# Patient Record
Sex: Male | Born: 2000 | Race: Black or African American | Hispanic: No | Marital: Single | Smoking: Former smoker
Health system: Southern US, Community
[De-identification: ages and names within clinical notes are randomized; demographics above are authoritative.]

## PROBLEM LIST (undated history)

## (undated) DIAGNOSIS — Z789 Other specified health status: Secondary | ICD-10-CM

## (undated) HISTORY — PX: NO PAST SURGERIES: SHX2092

## (undated) HISTORY — DX: Other specified health status: Z78.9

---
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
## 2019-03-28 ENCOUNTER — Ambulatory Visit (INDEPENDENT_AMBULATORY_CARE_PROVIDER_SITE_OTHER): Payer: BC Managed Care – PPO | Admitting: Internal Medicine

## 2019-03-28 ENCOUNTER — Encounter: Payer: Self-pay | Admitting: Internal Medicine

## 2019-03-28 VITALS — Ht 72.0 in | Wt 169.0 lb

## 2019-03-28 DIAGNOSIS — Z113 Encounter for screening for infections with a predominantly sexual mode of transmission: Secondary | ICD-10-CM | POA: Diagnosis not present

## 2019-03-28 DIAGNOSIS — E559 Vitamin D deficiency, unspecified: Secondary | ICD-10-CM

## 2019-03-28 DIAGNOSIS — Z0000 Encounter for general adult medical examination without abnormal findings: Secondary | ICD-10-CM

## 2019-03-28 DIAGNOSIS — R569 Unspecified convulsions: Secondary | ICD-10-CM

## 2019-03-28 NOTE — Progress Notes (Signed)
Virtual Visit via Video Note  I connected with Kenneth Cochran  on 03/28/19 at  3:00 PM EST by a video enabled telemedicine application and verified that I am speaking with the correct person using two identifiers.  Location patient:car Location provider:work or home office Persons participating in the virtual visit: patient, provider, friend in the car  I discussed the limitations of evaluation and management by telemedicine and the availability of in person appointments. The patient expressed understanding and agreed to proceed.   HPI: Annual  Seizures did not f/u with [facility] Dr. Lucia Gaskins missed appt will refer again still no etiology of seizures and no further w/u he did stop Juul since last visit 11/2018    Labs reviewed 12/25/18 ca 10.3 elevated we need to repeat at [facility]   ROS: See pertinent positives and negatives per HPI. General: wt stable  HEENT: no sore throat/covid 19 sx's  CV: no chest pain  Lungs: no sob  GI: no ab pain GU: no issues MSK: no jt pain  Neuro: +seizures  Psych: no anxiety/depression  Skin: normal no issues  Past Medical History:  Diagnosis Date  . No pertinent past medical history     Past Surgical History:  Procedure Laterality Date  . NO PAST SURGERIES      Family History  Problem Relation Age of Onset  . Utz' disease Maternal Grandmother     SOCIAL HX: Lives with mother and 1 brother  Rayna Sexton mom [place]    Current Outpatient Medications:  .  Cholecalciferol 100 MCG (4000 UT) CAPS, Take 1 capsule (4,000 Units total) by mouth daily., Disp: 90 capsule, Rfl: 3  EXAM:  VITALS per patient if applicable:  GENERAL: alert, oriented, appears well and in no acute distress  HEENT: atraumatic, conjunttiva clear, no obvious abnormalities on inspection of external nose and ears  NECK: normal movements of the head and neck  LUNGS: on inspection no signs of respiratory distress, breathing rate appears normal, no obvious gross SOB, gasping or  wheezing  CV: no obvious cyanosis  MS: moves all visible extremities without noticeable abnormality  PSYCH/NEURO: pleasant and cooperative, no obvious depression or anxiety, speech and thought processing grossly intact  ASSESSMENT AND PLAN:  Discussed the following assessment and plan:  Annual physical exam declines flu shot  Consider Tdap in future Check [facility] for vaccines  D3 rec 4000 IU daily otc   rec to stop Juul as of 03/28/2019 pt stopped congratulated  rec healthy diet, exercise and responsible decisions   Hypercalcemia - Plan: Basic Metabolic Panel (BMET), PTH, Intact and Calcium -will need further w/u if repeat labs abnormal   Vitamin D deficiency - Plan: Cholecalciferol 100 MCG (4000 IU) CAPS QD  Seizures (HCC) -resch appt [facility] further w/u     -we discussed possible serious and likely etiologies, options for evaluation and workup, limitations of telemedicine visit vs in person visit, treatment, treatment risks and precautions. Pt prefers to treat via telemedicine empirically rather then risking or undertaking an in person visit at this moment. Patient agrees to seek prompt in person care if worsening, new symptoms arise, or if is not improving with treatment.   I discussed the assessment and treatment plan with the patient. The patient was provided an opportunity to ask questions and all were answered. The patient agreed with the plan and demonstrated an understanding of the instructions.   The patient was advised to call back or seek an in-person evaluation if the symptoms worsen or if the  condition fails to improve as anticipated.  Time spent 15 min.  Nino Glow McLean-Scocuzza, MD

## 2019-04-02 DIAGNOSIS — R569 Unspecified convulsions: Secondary | ICD-10-CM | POA: Insufficient documentation

## 2019-04-02 DIAGNOSIS — Z0000 Encounter for general adult medical examination without abnormal findings: Secondary | ICD-10-CM | POA: Insufficient documentation

## 2019-04-02 MED ORDER — CHOLECALCIFEROL 100 MCG (4000 UT) PO CAPS: 1.0000 | ORAL_CAPSULE | Freq: Every day | ORAL | 3 refills | Status: AC

## 2019-04-04 ENCOUNTER — Ambulatory Visit: Payer: BC Managed Care – PPO | Admitting: Neurology

## 2019-04-04 ENCOUNTER — Telehealth: Payer: Self-pay

## 2019-04-04 ENCOUNTER — Telehealth: Payer: Self-pay | Admitting: Neurology

## 2019-04-04 NOTE — Telephone Encounter (Signed)
-----   Message from Delorise Jackson, MD sent at 04/02/2019  3:16 PM EST ----- F/u in 6 months Call mom to schedule Also advise mom that I advised pt to have labs at [facility] again to repeat elevated calcium and further work up Also take vitamin D3 4000 IU daily over the counter Mom needs to answer phone for neurology referral as well for seizuresThanksTMS

## 2019-04-04 NOTE — Telephone Encounter (Signed)
Left message for patient to return call back. [facility] may give and obtain information.  

## 2019-04-04 NOTE — Telephone Encounter (Signed)
Pt was a no show to her new consult apt today

## 2019-04-05 ENCOUNTER — Encounter: Payer: Self-pay | Admitting: Neurology

## 2019-04-06 ENCOUNTER — Telehealth: Payer: Self-pay | Admitting: Internal Medicine

## 2019-04-06 NOTE — Telephone Encounter (Signed)
I called mom regarding pt info and appt, mom states she will call me back to schedule F/u in 6 months  Call mom to schedule  Also advise mom that I advised pt to have labs at [facility] again to repeat elevated calcium and further work up  Also take vitamin D3 4000 IU daily over the counter  Mom needs to answer phone for neurology referral as well for seizures

## 2020-10-07 ENCOUNTER — Encounter (HOSPITAL_BASED_OUTPATIENT_CLINIC_OR_DEPARTMENT_OTHER): Payer: Self-pay

## 2020-10-07 ENCOUNTER — Emergency Department (HOSPITAL_BASED_OUTPATIENT_CLINIC_OR_DEPARTMENT_OTHER)
Admission: EM | Admit: 2020-10-07 | Discharge: 2020-10-07 | Disposition: A | Discharge status: Home / Self Care | Payer: BC Managed Care – PPO | Attending: Emergency Medicine | Admitting: Emergency Medicine

## 2020-10-07 ENCOUNTER — Other Ambulatory Visit: Payer: Self-pay

## 2020-10-07 DIAGNOSIS — Z28310 Unvaccinated for covid-19: Secondary | ICD-10-CM | POA: Diagnosis not present

## 2020-10-07 DIAGNOSIS — E876 Hypokalemia: Secondary | ICD-10-CM | POA: Insufficient documentation

## 2020-10-07 DIAGNOSIS — U071 COVID-19: Secondary | ICD-10-CM | POA: Diagnosis not present

## 2020-10-07 DIAGNOSIS — Z8616 Personal history of COVID-19: Secondary | ICD-10-CM | POA: Insufficient documentation

## 2020-10-07 DIAGNOSIS — B349 Viral infection, unspecified: Secondary | ICD-10-CM

## 2020-10-07 DIAGNOSIS — M7918 Myalgia, other site: Secondary | ICD-10-CM | POA: Diagnosis present

## 2020-10-07 DIAGNOSIS — Z87891 Personal history of nicotine dependence: Secondary | ICD-10-CM | POA: Diagnosis not present

## 2020-10-07 DIAGNOSIS — R52 Pain, unspecified: Secondary | ICD-10-CM

## 2020-10-07 LAB — COMPREHENSIVE METABOLIC PANEL
ALT: 61 U/L — ABNORMAL HIGH (ref 0–44)
AST: 49 U/L — ABNORMAL HIGH (ref 15–41)
Albumin: 4.4 g/dL (ref 3.5–5.0)
Alkaline Phosphatase: 74 U/L (ref 38–126)
Anion gap: 12 (ref 5–15)
BUN: 8 mg/dL (ref 6–20)
CO2: 24 mmol/L (ref 22–32)
Calcium: 9.5 mg/dL (ref 8.9–10.3)
Chloride: 103 mmol/L (ref 98–111)
Creatinine, Ser: 1.13 mg/dL (ref 0.61–1.24)
GFR, Estimated: >60 mL/min (ref >60)
Glucose, Bld: 88 mg/dL (ref 70–99)
Potassium: 3.4 mmol/L — ABNORMAL LOW (ref 3.5–5.1)
Sodium: 139 mmol/L (ref 135–145)
Total Bilirubin: 0.9 mg/dL (ref 0.3–1.2)
Total Protein: 7 g/dL (ref 6.5–8.1)

## 2020-10-07 LAB — CBC WITH DIFFERENTIAL/PLATELET
Abs Immature Granulocytes: 0.03 10*3/uL (ref 0.00–0.07)
Basophils Absolute: 0.1 10*3/uL (ref 0.0–0.1)
Basophils Relative: 1 %
Eosinophils Absolute: 0.1 10*3/uL (ref 0.0–0.5)
Eosinophils Relative: 2 %
HCT: 41 % (ref 39.0–52.0)
Hemoglobin: 13.6 g/dL (ref 13.0–17.0)
Immature Granulocytes: 0 %
Lymphocytes Relative: 6 %
Lymphs Abs: 0.4 10*3/uL — ABNORMAL LOW (ref 0.7–4.0)
MCH: 28.3 pg (ref 26.0–34.0)
MCHC: 33.2 g/dL (ref 30.0–36.0)
MCV: 85.2 fL (ref 80.0–100.0)
Monocytes Absolute: 0.6 10*3/uL (ref 0.1–1.0)
Monocytes Relative: 8 %
Neutro Abs: 5.6 10*3/uL (ref 1.7–7.7)
Neutrophils Relative %: 83 %
Platelets: 154 10*3/uL (ref 150–400)
RBC: 4.81 MIL/uL (ref 4.22–5.81)
RDW: 11.9 % (ref 11.5–15.5)
WBC: 6.8 10*3/uL (ref 4.0–10.5)
nRBC: 0 % (ref 0.0–0.2)

## 2020-10-07 LAB — RESP PANEL BY RT-PCR (FLU A&B, COVID) ARPGX2
Influenza A by PCR: NEGATIVE
Influenza B by PCR: NEGATIVE
SARS Coronavirus 2 by RT PCR: POSITIVE — AB

## 2020-10-07 LAB — TROPONIN I (HIGH SENSITIVITY): Troponin I (High Sensitivity): 2 ng/L (ref <18)

## 2020-10-07 MED ORDER — CYCLOBENZAPRINE HCL 10 MG PO TABS: 10.0000 mg | ORAL_TABLET | Freq: Two times a day (BID) | ORAL | 0 refills | Status: AC | PRN

## 2020-10-07 MED ORDER — ONDANSETRON HCL 4 MG PO TABS: 4.0000 mg | ORAL_TABLET | Freq: Four times a day (QID) | ORAL | 0 refills | Status: AC

## 2020-10-07 MED ORDER — POTASSIUM CHLORIDE CRYS ER 20 MEQ PO TBCR
40.0000 meq | EXTENDED_RELEASE_TABLET | Freq: Once | ORAL | Status: AC
  Administered 2020-10-07: 40 meq via ORAL
  Filled 2020-10-07: qty 2

## 2020-10-07 NOTE — ED Notes (Signed)
Dr. Dalene Seltzer aware of positive covid

## 2020-10-07 NOTE — ED Triage Notes (Signed)
Pt reports generalized body aches that started today - denies fever or resp sx

## 2020-10-07 NOTE — ED Provider Notes (Signed)
[facility] EMERGENCY DEPT Provider Note   CSN: [id number] Arrival date & time: 10/07/20  2110     History Chief Complaint  Patient presents with   Generalized Body Aches    Kenneth Cochran. is a 20 y.o. male.  HPI     20 year old male who denies any past medical history (per chart had vitamin D deficiency, hypercalcemia, seizure-like activity) who presents with concern for body aches.  Reports that he woke up this morning with pretty significant pain, radiating from his upper legs, upwards towards his back.  Describes it as a cramping pain.  Has associated cough and congestion.  Denies sore throat, nausea, vomiting, diarrhea or abdominal pain.  Reports he has some sensation of shortness of breath along with a cough and body aches.  No known fevers.  He believes it could be COVID, however has no known contacts.  He previously had COVID in 2020 or 2021. Has not been vaccinated. Describes a severe aching all over that makes it difficult to sleep.    Past Medical History:  Diagnosis Date   No pertinent past medical history     Patient Active Problem List   Diagnosis Date Noted   Annual physical exam 04/02/2019   Seizures (HCC) 04/02/2019   Vitamin D deficiency 12/26/2018   Hypercalcemia 12/26/2018    Past Surgical History:  Procedure Laterality Date   NO PAST SURGERIES         Family History  Problem Relation Age of Onset   Rhoads' disease Maternal Grandmother     Social History   Tobacco Use   Smoking status: Former    Pack years: 0.00    Types: E-cigarettes   Smokeless tobacco: Never  Substance Use Topics   Alcohol use: Not Currently   Drug use: Not Currently    Home Medications Prior to Admission medications   Medication Sig Start Date End Date Taking? Authorizing Provider  cyclobenzaprine (FLEXERIL) 10 MG tablet Take 1 tablet (10 mg total) by mouth 2 (two) times daily as needed for muscle spasms. 10/07/20  Yes Alvira Monday, MD   ondansetron (ZOFRAN) 4 MG tablet Take 1 tablet (4 mg total) by mouth every 6 (six) hours. 10/07/20  Yes Alvira Monday, MD  Cholecalciferol 100 MCG (4000 UT) CAPS Take 1 capsule (4,000 Units total) by mouth daily. 04/02/19   McLean-Scocuzza, Pasty Spillers, MD    Allergies    Patient has no known allergies.  Review of Systems   Review of Systems  Constitutional:  Positive for appetite change and fatigue. Negative for fever.  HENT:  Positive for congestion. Negative for sore throat.   Eyes:  Negative for visual disturbance.  Respiratory:  Positive for cough and shortness of breath.   Cardiovascular:  Negative for chest pain.  Gastrointestinal:  Negative for abdominal pain, constipation, nausea and vomiting.  Genitourinary:  Negative for difficulty urinating.  Musculoskeletal:  Positive for arthralgias and myalgias. Negative for back pain and neck stiffness.  Skin:  Negative for rash.  Neurological:  Negative for syncope and headaches.   Physical Exam Updated Vital Signs BP 116/90 (BP Location: Right Arm)   Pulse 100   Temp 98.7 F (37.1 C) (Oral)   Resp 20   Ht 6' (1.829 m)   Wt 68 kg   SpO2 100%   BMI 20.34 kg/m   Physical Exam Vitals and nursing note reviewed.  Constitutional:      General: He is not in acute distress.    Appearance:  He is well-developed. He is not diaphoretic.  HENT:     Head: Normocephalic and atraumatic.  Eyes:     Conjunctiva/sclera: Conjunctivae normal.  Cardiovascular:     Rate and Rhythm: Normal rate and regular rhythm.     Heart sounds: Normal heart sounds. No murmur heard.   No friction rub. No gallop.  Pulmonary:     Effort: Pulmonary effort is normal. No respiratory distress.     Breath sounds: Normal breath sounds. No wheezing or rales.  Abdominal:     General: There is no distension.     Palpations: Abdomen is soft.     Tenderness: There is no abdominal tenderness. There is no guarding.  Musculoskeletal:     Cervical back: Normal range  of motion.  Skin:    General: Skin is warm and dry.  Neurological:     Mental Status: He is alert and oriented to person, place, and time.    ED Results / Procedures / Treatments   Labs (all labs ordered are listed, but only abnormal results are displayed) Labs Reviewed  RESP PANEL BY RT-PCR (FLU A&B, COVID) [id number]  CBC WITH DIFFERENTIAL/PLATELET  COMPREHENSIVE METABOLIC PANEL  TROPONIN I (HIGH SENSITIVITY)    EKG EKG Interpretation  Date/Time:  Tuesday October 07 2020 21:37:31 EDT Ventricular Rate:  103 PR Interval:  183 QRS Duration: 79 QT Interval:  315 QTC Calculation: 413 R Axis:   90 Text Interpretation: Sinus tachycardia RAE, consider biatrial enlargement Borderline right axis deviation Borderline T abnormalities, inferior leads ST elev, probable normal early repol pattern No previous ECGs available Confirmed by Alvira Monday ([id number]) on 10/07/2020 9:43:04 PM  Radiology No results found.  Procedures Procedures   Medications Ordered in ED Medications - No data to display  ED Course  I have reviewed the triage vital signs and the nursing notes.  Pertinent labs & imaging results that were available during my care of the patient were reviewed by me and considered in my medical decision making (see chart for details).    MDM Rules/Calculators/A&P                           20 year old male who denies any past medical history (per chart had vitamin D deficiency, hypercalcemia, seizure-like activity) who presents with concern for body aches.   Given history of some electrolyte disturbance, ordered labs which showed no significant abnormalities.  Given potassium for mild hypokalemia. Troponin negative. Clear breath sounds bilaterally, doubt bacterial pneumonia, pneumothorax.  Suspect likely viral etiology of his symptoms, such as COVID-19.  He does not have features making him high risk for severe disease, recommend continued supportive care.  COVID 19 testing  positive.  Given prescription for Flexeril and Zofran. Patient discharged in stable condition with understanding of reasons to return.   Final Clinical Impression(s) / ED Diagnoses Final diagnoses:  Body aches  Viral syndrome  Suspected COVID-19 virus infection    Rx / DC Orders ED Discharge Orders          Ordered    cyclobenzaprine (FLEXERIL) 10 MG tablet  2 times daily PRN        10/07/20 2133    ondansetron (ZOFRAN) 4 MG tablet  Every 6 hours        10/07/20 2133             Alvira Monday, MD 10/08/20 0000
